# Patient Record
Sex: Male | Born: 1945 | Race: White | Hispanic: No | Marital: Married | Smoking: Never smoker
Health system: Southern US, Community
[De-identification: ages and names within clinical notes are randomized; demographics above are authoritative.]

## PROBLEM LIST (undated history)

## (undated) DIAGNOSIS — H4900 Third [oculomotor] nerve palsy, unspecified eye: Secondary | ICD-10-CM

## (undated) HISTORY — PX: KNEE SURGERY: SHX244

## (undated) HISTORY — PX: RECONSTRUCTION THUMB W/ TOE: SUR1092

## (undated) HISTORY — DX: Third (oculomotor) nerve palsy, unspecified eye: H49.00

---

## 2009-07-01 ENCOUNTER — Emergency Department (HOSPITAL_COMMUNITY): Admission: EM | Admit: 2009-07-01 | Discharge: 2009-07-01 | Payer: Self-pay | Admitting: Emergency Medicine

## 2010-12-05 LAB — DIFFERENTIAL
Basophils Absolute: 0 10*3/uL (ref 0.0–0.1)
Eosinophils Absolute: 0.7 10*3/uL (ref 0.0–0.7)
Eosinophils Relative: 7 % — ABNORMAL HIGH (ref 0–5)
Lymphocytes Relative: 24 % (ref 12–46)
Lymphs Abs: 2.5 10*3/uL (ref 0.7–4.0)
Monocytes Relative: 7 % (ref 3–12)
Neutro Abs: 6.4 10*3/uL (ref 1.7–7.7)
Neutrophils Relative %: 62 % (ref 43–77)

## 2010-12-05 LAB — BASIC METABOLIC PANEL
BUN: 12 mg/dL (ref 6–23)
CO2: 24 mEq/L (ref 19–32)
GFR calc Af Amer: >60 mL/min (ref >60)
GFR calc non Af Amer: >60 mL/min (ref >60)
Potassium: 4.4 mEq/L (ref 3.5–5.1)
Sodium: 140 mEq/L (ref 135–145)

## 2010-12-05 LAB — CBC
Hemoglobin: 16.9 g/dL (ref 13.0–17.0)
MCV: 87.7 fL (ref 78.0–100.0)
RBC: 5.6 MIL/uL (ref 4.22–5.81)

## 2011-03-02 ENCOUNTER — Emergency Department (HOSPITAL_COMMUNITY)
Admission: EM | Admit: 2011-03-02 | Discharge: 2011-03-02 | Disposition: A | Discharge status: Home / Self Care | Payer: Non-veteran care | Attending: Emergency Medicine | Admitting: Emergency Medicine

## 2011-03-02 DIAGNOSIS — R509 Fever, unspecified: Secondary | ICD-10-CM | POA: Insufficient documentation

## 2011-03-02 DIAGNOSIS — M2550 Pain in unspecified joint: Secondary | ICD-10-CM | POA: Insufficient documentation

## 2011-03-02 DIAGNOSIS — R29810 Facial weakness: Secondary | ICD-10-CM | POA: Insufficient documentation

## 2011-04-06 IMAGING — CT CT HEAD W/O CM
1 of 2 series · 13 of 30 positions shown, 17 images · non-contrast
Comparison: None

CLINICAL DATA: Left-sided facial droop.

CT HEAD WITHOUT CONTRAST
TECHNIQUE: Contiguous axial images were obtained from the base of
the skull through the vertex without contrast.

[Series 2: brain · axial · 0.47mm/px · z∈[-92,+54]mm · 13 of 32 slices shown, 17 images]
[im 3/32  brain]
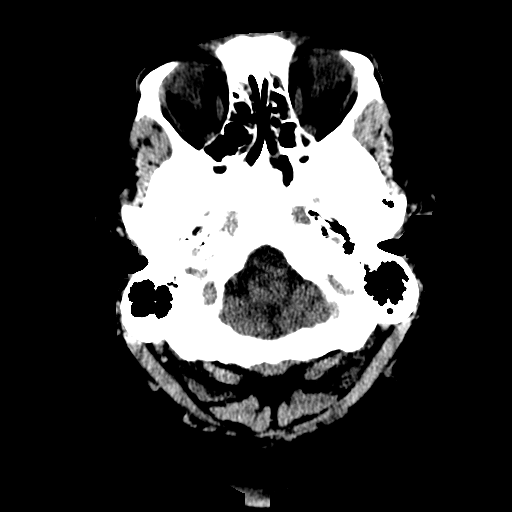
[im 3/32  bone]
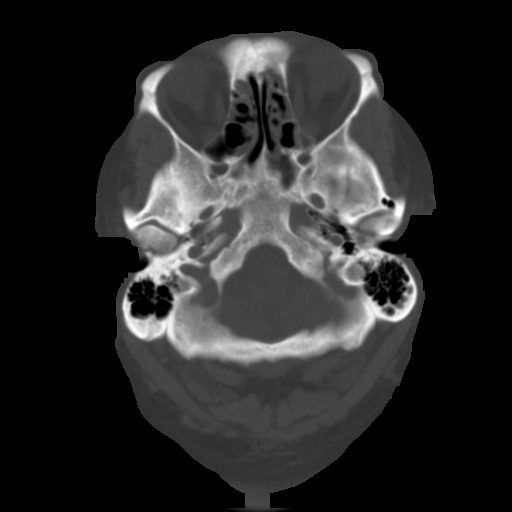
[im 5/32  brain]
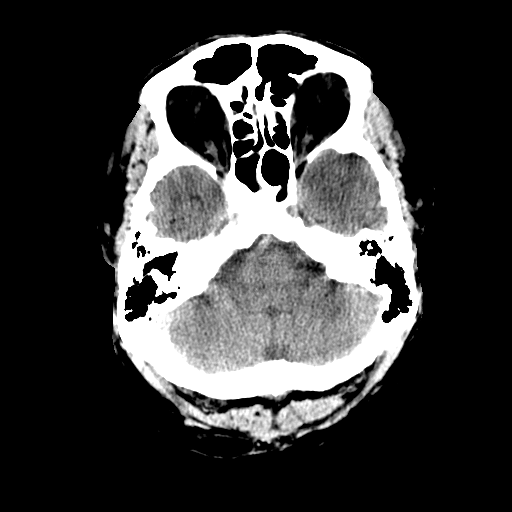
[im 7/32  brain]
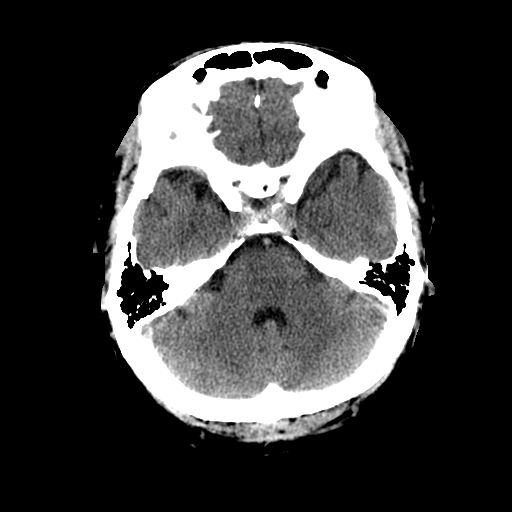
[im 9/32  brain]
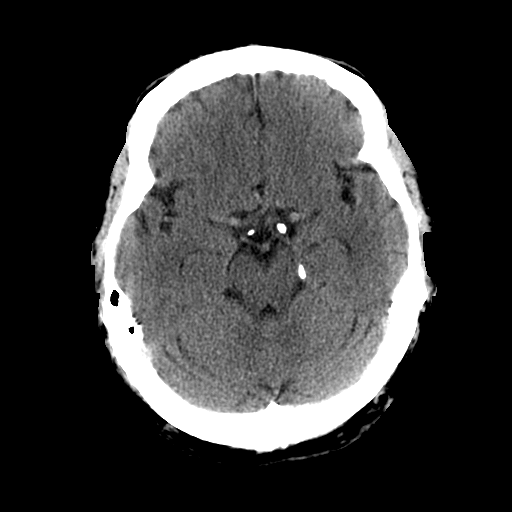
[im 12/32  brain]
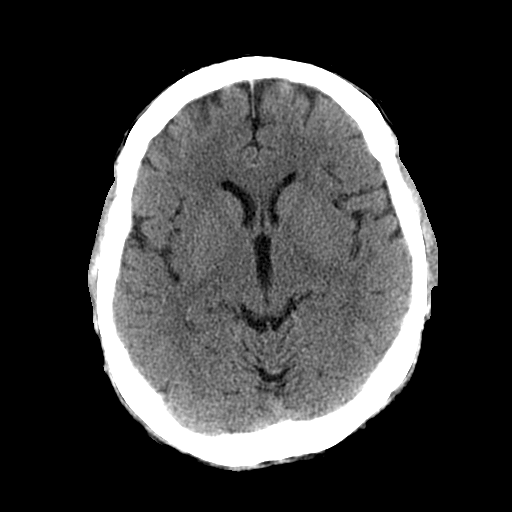
[im 12/32  bone]
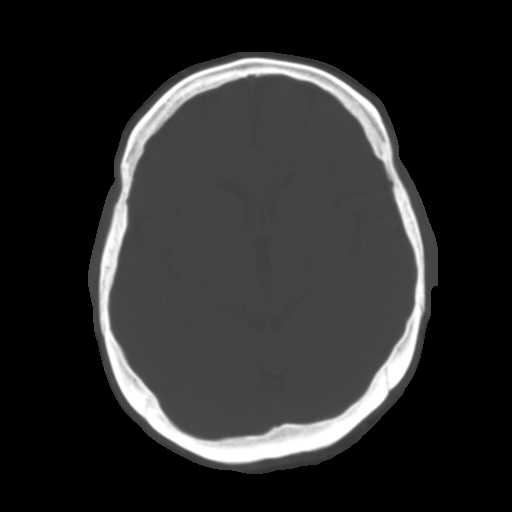
[im 14/32  brain]
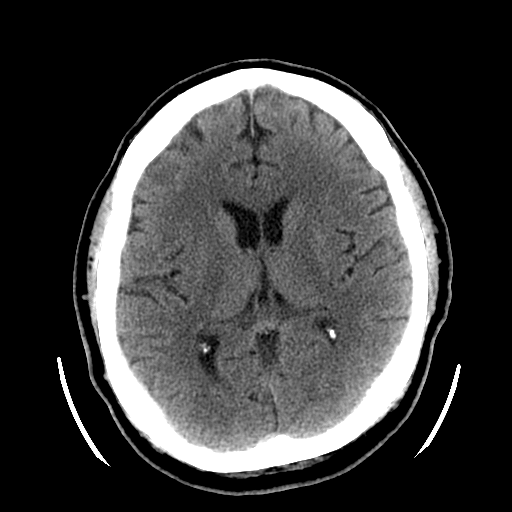
[im 16/32  brain]
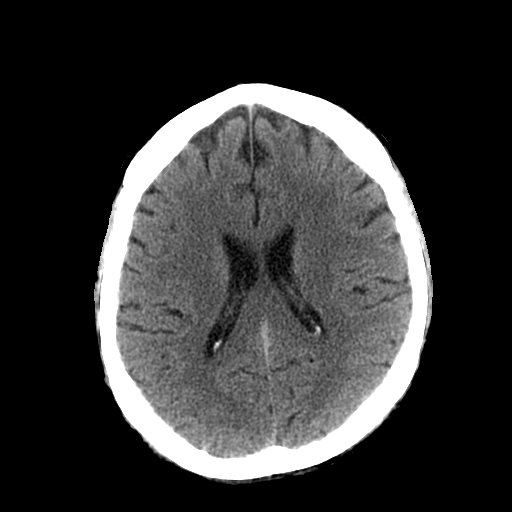
[im 18/32  brain]
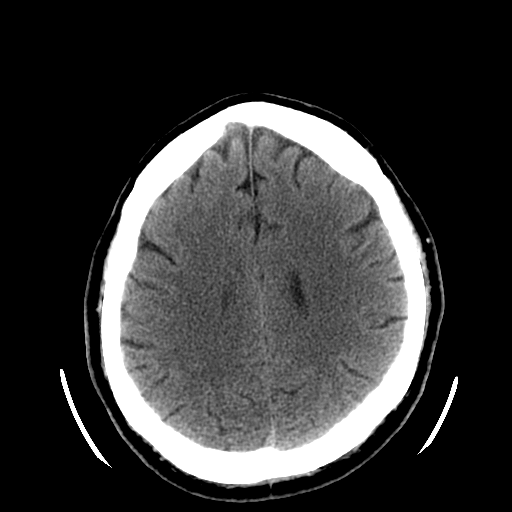
[im 20/32  brain]
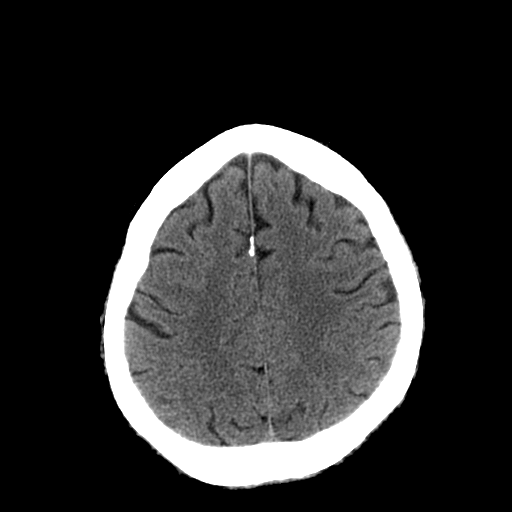
[im 20/32  bone]
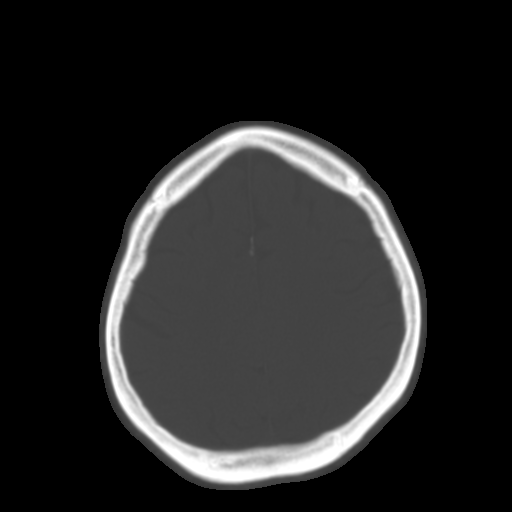
[im 23/32  brain]
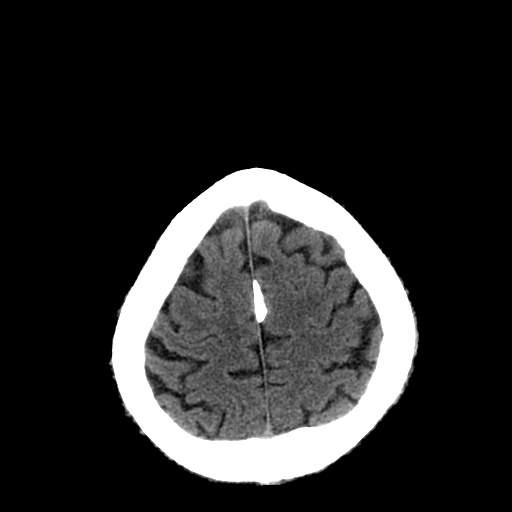
[im 25/32  brain]
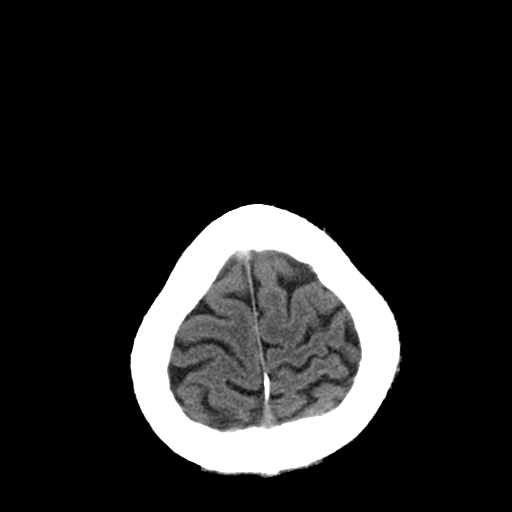
[im 27/32  brain]
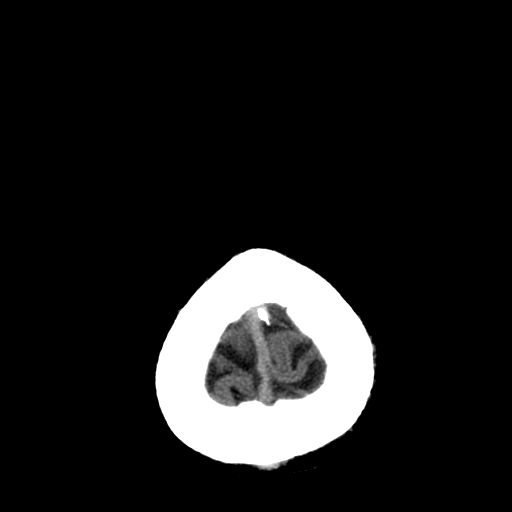
[im 29/32  brain]
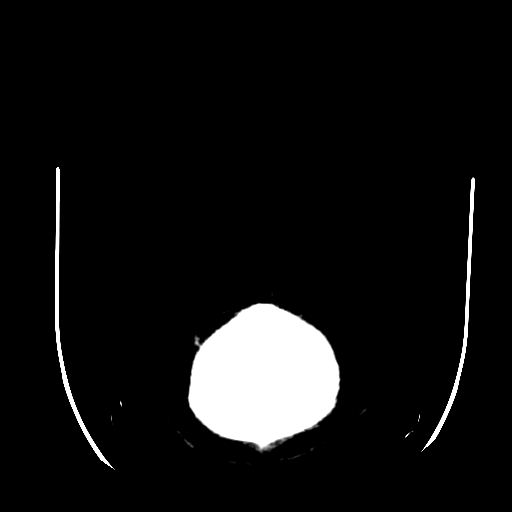
[im 29/32  bone]
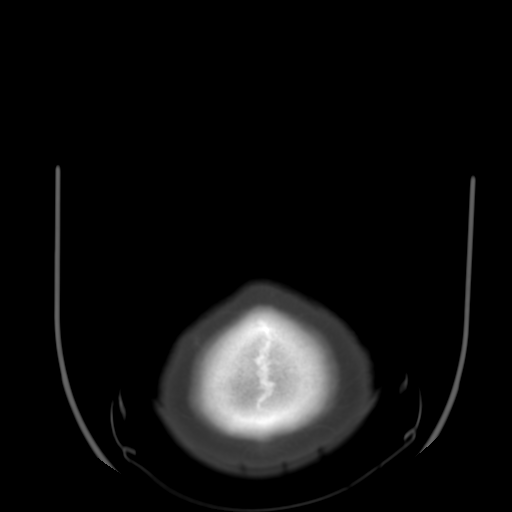

[13 of 30 positions shown; findings below may reference images not displayed]

FINDINGS: No acute intracranial abnormalities are identified,
including mass lesion or mass effect, hydrocephalus, extra-axial
fluid collection, midline shift, hemorrhage, or acute infarction.
Please note that acute infarction may be occult on CT for 24-48
hours.

The visualized bony calvarium is unremarkable.
Mild mucosal thickening within the ethmoid and right sphenoid
sinuses are noted.
IMPRESSION: No evidence of acute intracranial abnormality.

Very mild ethmoid and right sphenoid sinus disease.

## 2012-09-01 HISTORY — PX: OTHER SURGICAL HISTORY: SHX169

## 2014-02-22 ENCOUNTER — Encounter: Payer: Self-pay | Admitting: Neurology

## 2014-02-22 ENCOUNTER — Ambulatory Visit (INDEPENDENT_AMBULATORY_CARE_PROVIDER_SITE_OTHER): Payer: Medicare PPO | Admitting: Neurology

## 2014-02-22 VITALS — BP 159/86 | HR 61 | Resp 18 | Ht 73.25 in | Wt 286.0 lb

## 2014-02-22 DIAGNOSIS — H4900 Third [oculomotor] nerve palsy, unspecified eye: Secondary | ICD-10-CM | POA: Insufficient documentation

## 2014-02-22 DIAGNOSIS — H532 Diplopia: Secondary | ICD-10-CM

## 2014-02-22 DIAGNOSIS — Z8669 Personal history of other diseases of the nervous system and sense organs: Secondary | ICD-10-CM | POA: Insufficient documentation

## 2014-02-22 DIAGNOSIS — H4901 Third [oculomotor] nerve palsy, right eye: Secondary | ICD-10-CM

## 2014-02-22 HISTORY — DX: Third (oculomotor) nerve palsy, unspecified eye: H49.00

## 2014-02-22 MED ORDER — PREDNISONE 5 MG PO TABS: 5.0000 mg | ORAL_TABLET | Freq: Every day | ORAL | Status: DC

## 2014-02-22 NOTE — Progress Notes (Signed)
[facility]  Provider:  Melvyn Novasarmen  Eliberto Sole, M D  Referring Provider: Merlene LaughterStoneking, Hal, MD Primary Care Physician:  Jana HakimGANDHI,SANDHYA, MD  Chief Complaint  Patient presents with  . New Evaluation    Room 11  . Diplopia    HPI:  Kyle Bailey is a 68 y.o. 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    This patient presented the symptoms first.4 days ago or when he noticed his right eye to be droopy and when he suffered from diplopia. His driving was impaired. He presented yesterday to Dr. Nelle DonHollander and in return, I received a call about this patient, diagnosed with incomplete CN III palsy.    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         Review of Systems: Out of a complete 14 system review, the patient complains of only the following symptoms, and all other reviewed systems are negative. Diplopia onset 4-5 days ago, driving impaired.   History   Social History  . Marital Status: Married    Spouse Name: Kyle Bailey    Number of Children: 1  . Years of Education: 14   Occupational History  . Not on file.   Social History Main Topics  . Smoking status: Never Smoker   . Smokeless tobacco: Never Used  . Alcohol Use: Yes     Comment: rarely  . Drug Use: No  . Sexual Activity: Not on file   Other Topics Concern  . Not on file   Social History Narrative   Patient is married (Kyle Bailey) and lives at home with his wife.   Patient has one child and his wife has four children.   Patient is retired.   Patient has a college education.   Patient is ambi-dextrous.   Patient drinks 3-6 cups of coffee daily and very little soda.          Family History  Problem Relation Age of Onset  . Diabetes type II Mother   . Lung  cancer Father     Past Medical History  Diagnosis Date  . 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 Eyebrow surgery Left 2014  . Knee surgery Left   . Reconstruction thumb w/ toe      Current Outpatient Prescriptions  Medication Sig Dispense Refill  . aspirin 325 MG EC tablet Take 325 mg by mouth daily.       No current facility-administered medications for this visit.    Allergies as of 02/22/2014 - Review Complete 02/22/2014  Allergen Reaction Noted  . Other  02/22/2014    Vitals: BP 159/86  Pulse 61  Resp 18  Ht 6' 1.25" (1.861 m)  Wt 286 lb (129.729 kg)  BMI 37.46 kg/m2 Last Weight:  Wt Readings from Last 1 Encounters:  02/22/14 286 lb (129.729 kg)   Last Height:   Ht Readings from Last 1 Encounters:  02/22/14 6' 1.25" (1.861 m)    Physical exam:  General: The patient is awake, alert and appears not in acute distress. The patient is well groomed. Head: Normocephalic, atraumatic. Neck is supple. Mallampati 4 , neck circumference; 20 inches.  Cardiovascular:  Regular rate and rhythm ,  without  murmurs or carotid bruit, and without distended neck veins. Respiratory: Lungs are clear to auscultation. Skin:  Without evidence of edema, or rash Trunk: BMI is elevated and patient  has normal posture.  Neurologic exam : The patient is awake and alert, oriented to place and time.  Memory subjective  described as intact.  There is a normal attention span & concentration ability.  Speech is fluent without dysarthria, dysphonia or aphasia. Mood and affect are appropriate.  Cranial nerves: Pupil: direct light reaction on the right is delayed , consensual light reaction is intact either way. No mydriasis and no miosis at baseline .  Funduscopic exam without evidence of pallor or edema. beginning cataract in the right, floaters in the left . Extraocular movements  - down and out paralysis of the third CN. Diplopia skewed  with adduction and upwards gaze. The ptosis becomes more manifest with upward gaze, too.  Visual fields by finger perimetry are intact. Hearing to finger rub intact.  Facial sensation reduced to fine touch in the lower right face,   Facial motor strength is asymmetric, right lower facial droop. Tongue moved midline.Uvula barely visible.  Motor exam:  Normal tone , muscle bulk and symmetric normal strength in all extremities.  Sensory:  Fine touch, pinprick and vibration were tested in all extremities. Proprioception is normal.  Coordination: Rapid alternating movements in the fingers/hands is tested and normal.  Finger-to-nose maneuver tested and normal without evidence of ataxia, dysmetria or tremor.  Gait and station: Patient walks without assistive device and is able and assisted stool climb up to the exam table.  Strength within normal limits. Stance is stable and normal.  Tandem gait is unfragmented.  Romberg testing is normal.  Deep tendon reflexes: in the  upper and lower extremities are attenuated, symmetric and intact. Babinski maneuver response is attenuated.   Assessment:  After physical and neurologic examination, review of laboratory studies, imaging, neurophysiology testing and pre-existing records, assessment is   1)  No evidence of a stroke, but possible ischemic injury to the CN III, aneurysm is less likely - still CT angio or classic angiogram is indicated.      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   Plan:  Treatment plan and additional workup :  1) CMET, CBC, CRP, ANA , sed rate,  MRI  c/s contrast .fo

## 2014-02-23 ENCOUNTER — Other Ambulatory Visit: Payer: Self-pay | Admitting: Neurology

## 2014-02-23 DIAGNOSIS — Z8669 Personal history of other diseases of the nervous system and sense organs: Secondary | ICD-10-CM

## 2014-02-23 DIAGNOSIS — H4901 Third [oculomotor] nerve palsy, right eye: Secondary | ICD-10-CM

## 2014-02-23 DIAGNOSIS — H532 Diplopia: Secondary | ICD-10-CM

## 2014-02-23 LAB — CBC WITH DIFFERENTIAL/PLATELET
BASOS ABS: 0 10*3/uL (ref 0.0–0.2)
Basos: 0 %
Eos: 5 %
Eosinophils Absolute: 0.4 10*3/uL (ref 0.0–0.4)
HCT: 46.2 % (ref 37.5–51.0)
HEMOGLOBIN: 16 g/dL (ref 12.6–17.7)
IMMATURE GRANS (ABS): 0 10*3/uL (ref 0.0–0.1)
IMMATURE GRANULOCYTES: 0 %
LYMPHS ABS: 2.3 10*3/uL (ref 0.7–3.1)
Lymphs: 29 %
MCH: 29.4 pg (ref 26.6–33.0)
MCHC: 34.6 g/dL (ref 31.5–35.7)
MCV: 85 fL (ref 79–97)
MONOCYTES: 6 %
MONOS ABS: 0.5 10*3/uL (ref 0.1–0.9)
NEUTROS ABS: 4.9 10*3/uL (ref 1.4–7.0)
NEUTROS PCT: 60 %
RBC: 5.44 x10E6/uL (ref 4.14–5.80)
RDW: 14.6 % (ref 12.3–15.4)
WBC: 8.1 10*3/uL (ref 3.4–10.8)

## 2014-02-23 LAB — COMPREHENSIVE METABOLIC PANEL
A/G RATIO: 1.9 (ref 1.1–2.5)
ALBUMIN: 4.5 g/dL (ref 3.6–4.8)
ALK PHOS: 99 IU/L (ref 39–117)
ALT: 14 IU/L (ref 0–44)
AST: 11 IU/L (ref 0–40)
BILIRUBIN TOTAL: 0.5 mg/dL (ref 0.0–1.2)
BUN / CREAT RATIO: 13 (ref 10–22)
BUN: 13 mg/dL (ref 8–27)
CALCIUM: 9.2 mg/dL (ref 8.6–10.2)
CHLORIDE: 102 mmol/L (ref 97–108)
CO2: 21 mmol/L (ref 18–29)
Creatinine, Ser: 1.04 mg/dL (ref 0.76–1.27)
GFR calc Af Amer: 86 mL/min/{1.73_m2} (ref >59)
GFR calc non Af Amer: 74 mL/min/{1.73_m2} (ref >59)
Globulin, Total: 2.4 g/dL (ref 1.5–4.5)
Glucose: 90 mg/dL (ref 65–99)
Potassium: 4.7 mmol/L (ref 3.5–5.2)
Sodium: 140 mmol/L (ref 134–144)
TOTAL PROTEIN: 6.9 g/dL (ref 6.0–8.5)

## 2014-02-23 LAB — ANGIOTENSIN CONVERTING ENZYME: Angio Convert Enzyme: 34 U/L (ref 14–82)

## 2014-02-23 LAB — ANA W/REFLEX IF POSITIVE: Anti Nuclear Antibody(ANA): NEGATIVE

## 2014-02-24 ENCOUNTER — Ambulatory Visit
Admission: RE | Admit: 2014-02-24 | Discharge: 2014-02-24 | Disposition: A | Discharge status: Home / Self Care | Payer: Medicare PPO | Source: Ambulatory Visit | Attending: Neurology | Admitting: Neurology

## 2014-02-24 ENCOUNTER — Telehealth: Payer: Self-pay | Admitting: Neurology

## 2014-02-24 DIAGNOSIS — H4901 Third [oculomotor] nerve palsy, right eye: Secondary | ICD-10-CM

## 2014-02-24 DIAGNOSIS — H532 Diplopia: Secondary | ICD-10-CM

## 2014-02-24 DIAGNOSIS — Z8669 Personal history of other diseases of the nervous system and sense organs: Secondary | ICD-10-CM

## 2014-02-24 NOTE — Progress Notes (Signed)
Quick Note:  Spoke to patient and relayed lab results, per Dr. Vickey Hugerohmeier. ______

## 2014-02-24 NOTE — Telephone Encounter (Signed)
Spoke to patient and relayed lab results and follow up visit on 03-08-14.  Wife got on the phone and wanted patient in sooner and was very anxious about getting the results right away for the MRI brain.  I explained the process and told her I would relay to the doctor that they would like the MRI results as soon as possible.

## 2014-02-27 ENCOUNTER — Telehealth: Payer: Self-pay | Admitting: Neurology

## 2014-02-27 ENCOUNTER — Ambulatory Visit
Admission: RE | Admit: 2014-02-27 | Discharge: 2014-02-27 | Disposition: A | Discharge status: Home / Self Care | Payer: Medicare PPO | Source: Ambulatory Visit | Attending: Neurology | Admitting: Neurology

## 2014-02-27 DIAGNOSIS — Z8669 Personal history of other diseases of the nervous system and sense organs: Secondary | ICD-10-CM

## 2014-02-27 DIAGNOSIS — H4900 Third [oculomotor] nerve palsy, unspecified eye: Secondary | ICD-10-CM

## 2014-02-27 DIAGNOSIS — H4901 Third [oculomotor] nerve palsy, right eye: Secondary | ICD-10-CM

## 2014-02-27 DIAGNOSIS — H532 Diplopia: Secondary | ICD-10-CM

## 2014-02-27 MED ORDER — GADOBENATE DIMEGLUMINE 529 MG/ML IV SOLN
20.0000 mL | Freq: Once | INTRAVENOUS | Status: AC | PRN
  Administered 2014-02-27: 20 mL via INTRAVENOUS

## 2014-02-27 NOTE — Telephone Encounter (Signed)
Called patient and left VM message that appt.with Dr Vickey Hugerohmeier, call back to confirm

## 2014-02-27 NOTE — Telephone Encounter (Signed)
Called patient and scheduled Thursday AM , does not wish to see NP for this visit, is scheduled to have MRI tonight, all other labs are normal

## 2014-02-27 NOTE — Telephone Encounter (Signed)
Patient's wife calling to state that patient's symptoms are getting worse--he is dizzier, losing balance, and the diplopia is getting worse. Please return call to patient's wife and advise.

## 2014-02-27 NOTE — Telephone Encounter (Signed)
Wife called back to confirmed appt

## 2014-02-27 NOTE — Telephone Encounter (Signed)
Please bring him in for Thursday AM appointment. Have any tests returned yet?

## 2014-02-28 ENCOUNTER — Telehealth: Payer: Self-pay | Admitting: Neurology

## 2014-02-28 ENCOUNTER — Telehealth: Payer: Self-pay | Admitting: *Deleted

## 2014-02-28 ENCOUNTER — Telehealth (HOSPITAL_COMMUNITY): Payer: Self-pay | Admitting: Interventional Radiology

## 2014-02-28 NOTE — Telephone Encounter (Signed)
Opened in error

## 2014-02-28 NOTE — Telephone Encounter (Signed)
Was informed by front check out we are not to remove any appt.when scheduling  a sooner one, has to be taken off by them after physician approves a new appt.time after office visit

## 2014-02-28 NOTE — Telephone Encounter (Signed)
Patient's wife calling for results of recent MRI. Please call to advise.

## 2014-02-28 NOTE — Telephone Encounter (Signed)
Called patient to r/s appointment to a later time on 03/08/14, left message to return the call.

## 2014-02-28 NOTE — Telephone Encounter (Signed)
After looking at phone note 02/27/14, patient has an appointment with Dr. Vickey Hugerohmeier on 03/02/14, therefore appointment for 03/08/14 with NP MM, should be cancelled. Information was given to Ammie S to cancel appointment because she changed patient's appointment.

## 2014-02-28 NOTE — Telephone Encounter (Signed)
We received a referral from Dr. Vickey Hugerohmeier for [name] to see Mr. Kyle Bailey in consult to discuss his diplopia and third nerve palsy. Called pt to schedule the consultation and patient said he did not understand what he was being referred to us for. He stated that he just had an MRI done last night and does not understand how Dr. Vickey Hugerohmeier could refer him to another doctor when she does not even know what he has wrong since there hasn't even been enough time for her to get the results of the test done yet. He said he did not want to see [name] until he sees Dohmeier for follow-up and to get the results of the MRI. I told him that was perfectly fine and to contact us if needed. JM

## 2014-02-28 NOTE — Telephone Encounter (Signed)
Informed patient's wife that MRI results have not been reviewed by physician and will contact once review, she verbalized understanding but wanted to know why she is being referred to another physician([name]). The wife said the the patient is very anxious for results

## 2014-03-01 NOTE — Telephone Encounter (Signed)
Spoke with wife and she said that a form is being sent to Dr Vickey Hugerohmeier for patient to see a [facility]

## 2014-03-01 NOTE — Telephone Encounter (Signed)
Patient's wife is returning a call.  

## 2014-03-02 ENCOUNTER — Other Ambulatory Visit: Payer: Self-pay | Admitting: Radiology

## 2014-03-02 ENCOUNTER — Encounter: Payer: Self-pay | Admitting: Neurology

## 2014-03-02 ENCOUNTER — Ambulatory Visit (INDEPENDENT_AMBULATORY_CARE_PROVIDER_SITE_OTHER): Payer: Medicare PPO | Admitting: Neurology

## 2014-03-02 ENCOUNTER — Other Ambulatory Visit: Payer: Self-pay | Admitting: Neurology

## 2014-03-02 VITALS — BP 143/81 | HR 70 | Resp 16 | Ht 73.0 in | Wt 288.0 lb

## 2014-03-02 DIAGNOSIS — H4900 Third [oculomotor] nerve palsy, unspecified eye: Secondary | ICD-10-CM

## 2014-03-02 DIAGNOSIS — H4901 Third [oculomotor] nerve palsy, right eye: Secondary | ICD-10-CM

## 2014-03-02 DIAGNOSIS — Z8669 Personal history of other diseases of the nervous system and sense organs: Secondary | ICD-10-CM

## 2014-03-02 DIAGNOSIS — H532 Diplopia: Secondary | ICD-10-CM

## 2014-03-02 NOTE — Patient Instructions (Signed)
Arteriogram ° An arteriogram (or angiogram) is an X-ray test of your blood vessels. You will be awake during the test. This test looks for: °· Blocked blood vessels. °· Blood vessels that are not normal. °BEFORE THE TEST °Schedule an appointment for your arteriogram. Let the person know if: °· You have diabetes. °· You are allergic to any food or medicine. °· You are allergic to X-ray dye (contrast). °· You have asthma or had it when you were a child. °· You are pregnant or you might be pregnant. °· You are taking aspirin or blood thinners. °The night before the test:  °· Do not  eat or drink anything after midnight. °On the morning of your test:  °· Do not drive. Have someone bring you and take you home. °· Bring all the medicines you need to take with you. If you have diabetes, do not take your insulin before the test, but bring it with you. °THE TEST °· You will lie on the X-ray table. °· An IV tube is started in your arm. °· Your blood pressure and heartbeat are checked during the test. °· The upper part of your leg (groin) is shaved and washed with special soap. °· You are then covered with a germ free (sterile) sheet. Keep your arms at your side under the sheet at all times so that germs do not get on the sheet. °· The skin is numbed where the thin tube (catheter) is put into your upper leg. The thin tube is moved up into the blood vessels that your doctor wants to see. °· Dye is put in through the tube. You may have a feeling of heat as the dye moves into your body. °· X-ray pictures of your blood vessels are taken. Do not move while the dye goes in and pictures are taken. °AFTER THE TEST °· The thin tube will be taken out of your upper leg. °· The nurse will check: °¨ Your blood pressure. °¨ The place where the thin tube was taken out to make sure it is not bleeding. °¨ The blood flow to your feet. °· Lie flat in bed. Keep your leg straight for 6 hours after the thin tube is taken out. °· Ask your doctor or  nurse if you should take your regular medications that you brought. °Finding out the results of your test °Ask when your test results will be ready. Make sure you get your test results. °Document Released: 11/14/2008 Document Revised: 08/23/2013 Document Reviewed: 11/14/2008 °ExitCare® Patient Information ©2015 [employer]. This information is not intended to replace advice given to you by your health care provider. Make sure you discuss any questions you have with your health care provider. ° °

## 2014-03-02 NOTE — Progress Notes (Signed)
[facility]  Provider:  Melvyn Novasarmen  Kyle Bailey, M D  Referring Provider: No ref. provider found Primary Care Physician:  Kyle HakimGANDHI,SANDHYA, MD  Chief Complaint  Patient presents with  . Follow-up    Room 11  . Diplopia    HPI:  Kyle Bailey is a 68 y.o. caucasian, married  male , who is seen here as a referral from ophthalmologist Dr. 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    This patient presented the symptoms first.4 days ago or when he noticed his right eye to be droopy and when he suffered from diplopia. His driving was impaired. He presented yesterday to Dr. Nelle DonHollander and in return, I received a call about this patient, diagnosed with incomplete CN III palsy.  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  He has been seen here today in his first revisit. 03-02-14, for his third nerve palsy. As in he is first visit, he uses an eye patch, he has intact right abduction , but affected adduction and abnormal red lens test- unchanged over one week.  He had an unremarkable MRI contrast study , MRA and is waiting for the catheters angiogram next Monday with Dr. Corliss Skainseveshwar.  No tumour found. No stroke found.  He had normal labs , no evidence of sarcoid, sed rate low at 10 mm, no need for prednisone. He can continue his aspirin daily.                Review of Systems: Out of a complete 14 system review, the patient complains of only the following symptoms, and all other reviewed systems are negative. Diplopia onset 4-5 days ago, driving impaired.   History   Social History  . Marital Status: Married    Spouse Name: Kyle Bailey    Number of Children: 1  . Years of Education: 14   Occupational History  . Not on file.   Social History Main Topics  . Smoking status: Never Smoker   .  Smokeless tobacco: Never Used  . Alcohol Use: Yes     Comment: rarely  . Drug Use: No  . Sexual Activity: Not on file   Other Topics Concern  . Not on file   Social History Narrative   Patient is married (Kyle Bailey) and lives at home with his wife.   Patient has one child and his wife has four children.   Patient is retired.   Patient has a college education.   Patient is ambi-dextrous.   Patient drinks 3-6 cups of coffee daily and very little soda.          Family History  Problem Relation Age of Onset  . Diabetes type II Mother   . Lung cancer Father     Past Medical History  Diagnosis Date  . 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 Eyebrow surgery Left 2014  . Knee surgery Left   . Reconstruction thumb w/ toe      Current Outpatient Prescriptions  Medication Sig Dispense Refill  . aspirin 325 MG EC tablet Take 325 mg by mouth daily.      . predniSONE (DELTASONE) 5 MG tablet        No current facility-administered medications for this visit.    Allergies as of  03/02/2014 - Review Complete 03/02/2014  Allergen Reaction Noted  . Other  02/22/2014    Vitals: BP 143/81  Pulse 70  Resp 16  Ht 6\' 1"  (1.854 m)  Wt 288 lb (130.636 kg)  BMI 38.01 kg/m2 Last Weight:  Wt Readings from Last 1 Encounters:  03/02/14 288 lb (130.636 kg)   Last Height:   Ht Readings from Last 1 Encounters:  03/02/14 6\' 1"  (1.854 m)    Physical exam:  General: The patient is awake, alert and appears not in acute distress. The patient is well groomed. Head: Normocephalic, atraumatic. Neck is supple. Mallampati 4 , neck circumference; 20 inches.  Cardiovascular:  Regular rate and rhythm , without  murmurs or carotid bruit, and without distended neck veins. Respiratory: Lungs are clear to auscultation. Skin:  Without evidence of edema, or rash Trunk: BMI is elevated and patient  has normal posture.  Neurologic exam : The patient  is awake and alert, oriented to place and time.  Memory subjective  described as intact.  There is a normal attention span & concentration ability.  Speech is fluent without dysarthria, dysphonia or aphasia. Mood and affect are appropriate.  Cranial nerves: Pupil: direct light reaction on the right is delayed , consensual light reaction is intact either way. No mydriasis and no miosis at baseline .  Funduscopic exam without evidence of pallor or edema. beginning cataract in the right, floaters in the left . Extraocular movements  - down and out paralysis of the third CN. Diplopia skewed , red lens test- produced by adduction and upwards gaze with the right eye.  Red lens test.  The ptosis becomes more manifest with upward gaze, too.  Visual fields by finger perimetry are intact. Hearing to finger rub intact.  Facial sensation reduced to fine touch in the lower right face,   Facial motor strength is asymmetric, right lower facial droop. Tongue moved midline.Uvula barely visible.  Motor exam:  Normal tone , muscle bulk and symmetric normal strength in all extremities. Right facial droop.   Sensory:  Fine touch, pinprick and vibration were tested in all extremities. Proprioception is normal.  Coordination: Rapid alternating movements in the fingers/hands is tested and normal.  Finger-to-nose maneuver tested and normal without evidence of ataxia, dysmetria or tremor.  Gait and station: Patient walks without assistive device and is able and assisted stool climb up to the exam table.  Strength within normal limits. Stance is stable and normal.  Tandem gait is unfragmented.  Romberg testing is normal.  Deep tendon reflexes: in the  upper and lower extremities are attenuated, symmetric and intact. Babinski maneuver response is attenuated.   Assessment:  After physical and neurologic examination, review of laboratory studies, imaging, neurophysiology testing and pre-existing records, assessment  is   1)  No evidence of a stroke, but possible ischemic injury to the CN III, aneurysm is less likely -  classic angiogram is indicated.      The patient has had another CN palsy affecting CN VII before - ruled out sarcoid, microvascular injuries.   Plan:  Treatment plan and additional workup :  1) CMET, CBC, CRP, ANA , sed rate,  MRI  c/s contrast reviewed. No abnormalities seen.  Will need angiogram by catheter now, with Dr. Corliss Skainseveshwar , on Monday after independence day.  He [facility].  I send one to both, as he is keen on an early as possible appointment.

## 2014-03-06 ENCOUNTER — Ambulatory Visit (HOSPITAL_COMMUNITY)
Admission: RE | Admit: 2014-03-06 | Discharge: 2014-03-06 | Disposition: A | Discharge status: Home / Self Care | Payer: Medicare PPO | Source: Ambulatory Visit | Attending: Neurology | Admitting: Neurology

## 2014-03-06 DIAGNOSIS — Z8669 Personal history of other diseases of the nervous system and sense organs: Secondary | ICD-10-CM

## 2014-03-06 DIAGNOSIS — H532 Diplopia: Secondary | ICD-10-CM

## 2014-03-06 DIAGNOSIS — H4900 Third [oculomotor] nerve palsy, unspecified eye: Secondary | ICD-10-CM

## 2014-03-07 ENCOUNTER — Telehealth (HOSPITAL_COMMUNITY): Payer: Self-pay | Admitting: Interventional Radiology

## 2014-03-07 ENCOUNTER — Telehealth: Payer: Self-pay | Admitting: *Deleted

## 2014-03-07 ENCOUNTER — Telehealth: Payer: Self-pay | Admitting: Neurology

## 2014-03-07 NOTE — Telephone Encounter (Signed)
Spoke to BancroftJennifer from Dr. Fatima Sangereveshwar's office and the patient had an appointment yesterday for cerebral angiogram but did not show.  She wants to know if she should pursue contacting patient?

## 2014-03-07 NOTE — Telephone Encounter (Signed)
Spoke to the patient's wife, and they did not [facility] to [facility] and the referral for [facility] has been set-up for March 16, 2014 at 1:30 pm.

## 2014-03-07 NOTE — Telephone Encounter (Signed)
Per Dr. Vickey Hugerohmeier, the patient has decided not to have this procedure done and will let [facility] decide if this test is needed or not.  Dr. Vickey Hugerohmeier spoke to the patient's wife about this matter and left a detailed message for Victorino DikeJennifer at Dr. Corliss Skainseveshwar office that the appointment does not need to be re-scheduled.

## 2014-03-07 NOTE — Telephone Encounter (Signed)
Patient's wife was called to give the information, concerning the [facility].  Please call/link Annabelle HarmanDana to discuss this with her.

## 2014-03-07 NOTE — Telephone Encounter (Signed)
Called Dr. Vickey Hugerohmeier to let them know that the patient did not show for his scheduled angiogram on 03/06/14. JMichaux

## 2014-03-08 ENCOUNTER — Ambulatory Visit: Payer: Medicare PPO | Admitting: Adult Health

## 2014-03-08 ENCOUNTER — Encounter: Payer: Self-pay | Admitting: Neurology

## 2014-03-08 NOTE — Telephone Encounter (Signed)
Per Annabelle Harmanana, has received and faxed  the form from [facility] needed by patient

## 2014-03-08 NOTE — Telephone Encounter (Signed)
See office note 03-02-14.

## 2014-03-08 NOTE — Telephone Encounter (Signed)
Dr Ashok Cordia's. Assistant called stated that patient had not showed ofr his  ASAP angio- which we arranged on Thursday last.  Patient did not appear to his cathether angio with Dr Corliss Skainseveshwar and stated he had cancelled it on Friday , July 3rd, through the hospital phone message system. His wife found this study " too invasive "- they also did not longer to want to condier [facility] neuro-ophthamology and want [facility] only. Please arrange [facility].   No further appointments.

## 2014-03-08 NOTE — Progress Notes (Signed)
This encounter was created in error - please disregard.

## 2014-03-09 ENCOUNTER — Telehealth: Payer: Self-pay | Admitting: Neurology

## 2014-03-09 NOTE — Telephone Encounter (Signed)
Hillary with [facility] calling to relay that patient did not show for his scheduled urgent referral with them.

## 2014-03-09 NOTE — Telephone Encounter (Signed)
[facility] at [facility] was contacted that the [facility] to [facility] so the referral is not needed.

## 2014-03-17 ENCOUNTER — Ambulatory Visit: Payer: Medicare PPO | Admitting: Neurology

## 2014-03-20 NOTE — Progress Notes (Signed)
Quick Note:  Spoke to patient and relayed MRI brain showing sinusitis, no swelling of the optic nerve, per Dr. Vickey Hugerohmeier. ______

## 2015-11-30 IMAGING — CR DG ORBITS FOR FOREIGN BODY
2 series · 2 of 2 positions shown · non-contrast
Comparison: None.

CLINICAL DATA: Metal working/exposure; clearance prior to MRI

EXAM:
ORBITS FOR FOREIGN BODY - 2 VIEW

[view not recorded (1 of 2)]
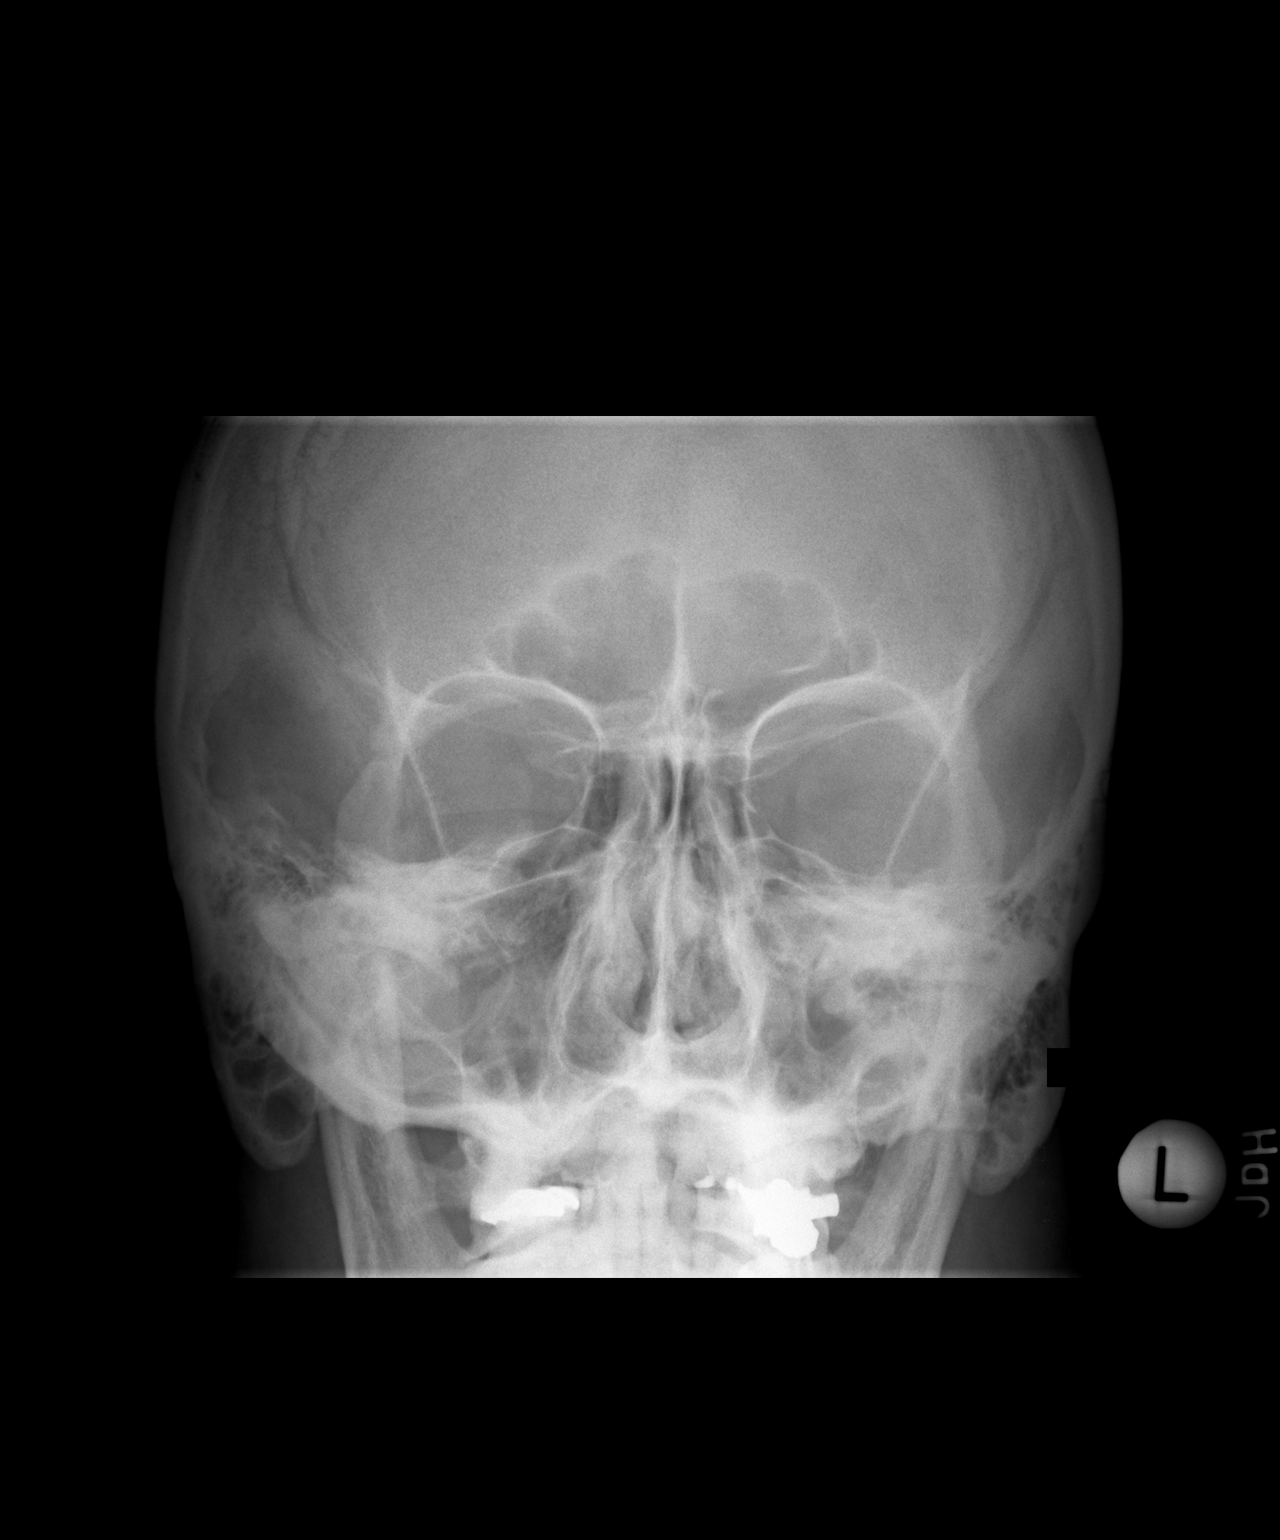

[view not recorded (2 of 2)]
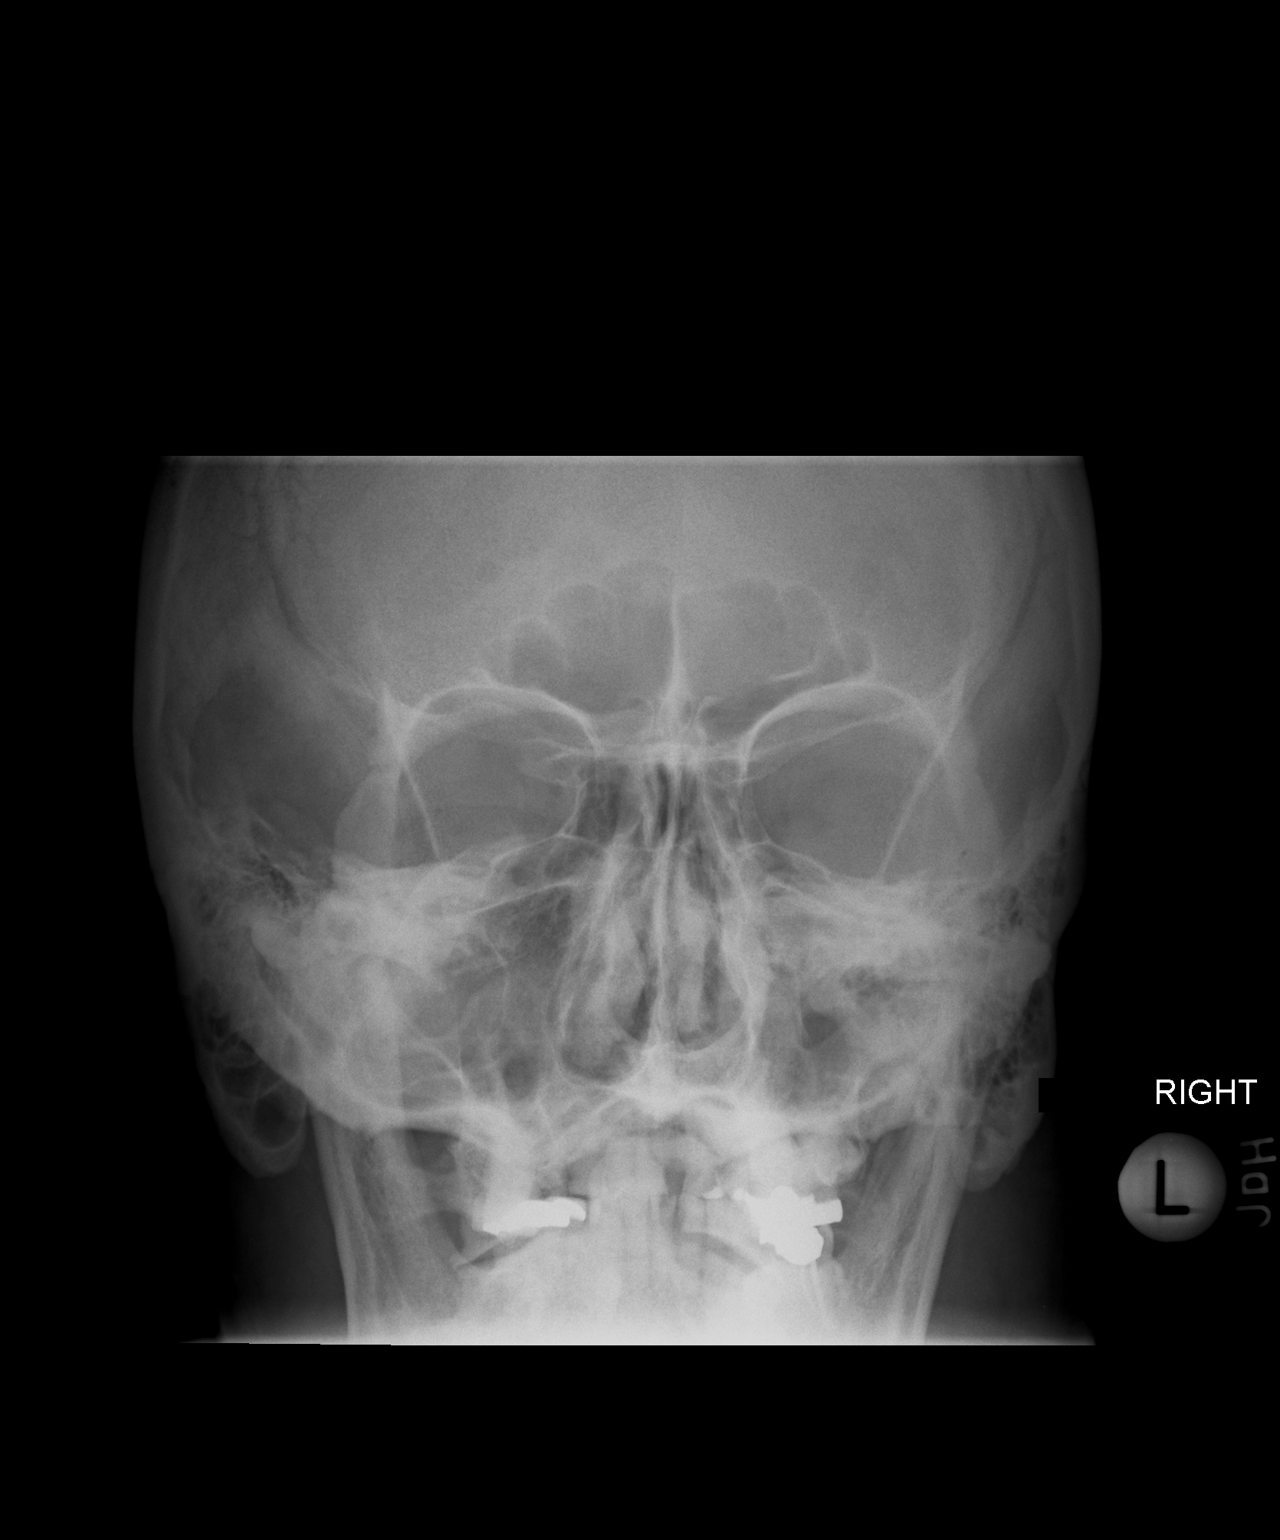

[2 of 2 positions shown; findings below may reference images not displayed]

FINDINGS: There is no evidence of metallic foreign body within the orbits. No
significant bone abnormality identified.
IMPRESSION: No evidence of metallic foreign body within the orbits.
